# Patient Record
Sex: Male | Born: 1975 | Hispanic: Yes | Marital: Single
Health system: Southern US, Community
[De-identification: ages and names within clinical notes are randomized; demographics above are authoritative.]

## PROBLEM LIST (undated history)

## (undated) HISTORY — PX: EYE SURGERY: SHX253

## (undated) HISTORY — PX: MASS EXCISION: SHX2000

---

## 2014-08-12 ENCOUNTER — Ambulatory Visit: Payer: Self-pay | Admitting: Ophthalmology

## 2015-01-24 NOTE — Op Note (Signed)
PATIENT NAME:  Hector Rangel, [name] MR#:  [id number] DATE OF BIRTH:  1976-06-11  DATE OF PROCEDURE:  08/12/2014  LOCATION:  [facility].   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   OPERATIVE PROCEDURE: Pterygium excision with creation of a conjunctival autograft.   SURGEON: Jerilee FieldWilliam L. Glendel Jaggers, MD.   ANESTHESIA:   1.  Managed anesthesia care.  2.  A 50-50 mixture of 0.75% bupivacaine and 4% Xylocaine.   COMPLICATIONS: None.   DESCRIPTION OF PROCEDURE: The patient was examined and consented for this procedure with the assistance of a professional Spanish interpreter in the preoperative holding area. He was then brought back to the operating room where the anesthesia team employed managed anesthesia care while 5 mL of the aforementioned mixture were placed in the right orbit. The eye was then prepped and draped in the usual sterile ophthalmic fashion. A lid speculum was placed. A single 5-0 silk suture was placed through the superior limbus as a stay suture.   The eye was rotated laterally. A corneal shield was placed. The pterygium was removed with a combination of sharp and blunt dissection with the Westcott scissors. The Beaver blade was used to gently scrape residual scar tissue and Bowman layer of the cornea. The conjunctival defect was measured to be 9 mm x 9 mm.  The stay suture was then used to rotate the eye inferiorly and a conjunctival autograft was measured out at 9 mm x 9 mm.  This graft was created using a combination of sharp and blunt dissection. The graft was moved over the bare sclera medially maintaining its original orientation to the surface of the globe. A total of 8 interrupted 9-0 Vicryl sutures were used to secure the graft in place.   The stay suture was removed and light shield was removed and the eye was dressed with Vigamox followed by Maxitrol  ointment and a gentle pressure patch. The patient was instructed to leave the patch in place until the following morning. He was given a prescription for Percocet should he need it for discomfort overnight.  I will follow up with him in 1 day.    ____________________________ Jerilee FieldWilliam L. Apurva Reily, MD wlp:lt D: 08/12/2014 13:08:08 ET T: 08/12/2014 13:26:11 ET JOB#: [id number]  cc: Dominick Morella L. Oshua Mcconaha, MD, <Dictator>    Jerilee FieldWILLIAM L Jama Mcmiller MD ELECTRONICALLY SIGNED 08/13/2014 9:41

## 2022-01-17 ENCOUNTER — Emergency Department: Payer: BC Managed Care – PPO

## 2022-01-17 ENCOUNTER — Emergency Department
Admission: EM | Admit: 2022-01-17 | Discharge: 2022-01-17 | Disposition: A | Discharge status: Home / Self Care | Payer: BC Managed Care – PPO | Attending: Emergency Medicine | Admitting: Emergency Medicine

## 2022-01-17 ENCOUNTER — Other Ambulatory Visit: Payer: Self-pay

## 2022-01-17 DIAGNOSIS — N50819 Testicular pain, unspecified: Secondary | ICD-10-CM | POA: Diagnosis not present

## 2022-01-17 DIAGNOSIS — N50811 Right testicular pain: Secondary | ICD-10-CM | POA: Diagnosis not present

## 2022-01-17 LAB — URINALYSIS, ROUTINE W REFLEX MICROSCOPIC
Bilirubin Urine: NEGATIVE
Glucose, UA: NEGATIVE mg/dL
Hgb urine dipstick: NEGATIVE
Ketones, ur: NEGATIVE mg/dL
Leukocytes,Ua: NEGATIVE
Nitrite: NEGATIVE
Protein, ur: NEGATIVE mg/dL
Specific Gravity, Urine: 1.024 (ref 1.005–1.030)
pH: 5 (ref 5.0–8.0)

## 2022-01-17 LAB — CHLAMYDIA/NGC RT PCR (ARMC ONLY)
Chlamydia Tr: NOT DETECTED
N gonorrhoeae: NOT DETECTED

## 2022-01-17 MED ORDER — MELOXICAM 15 MG PO TABS: 15.0000 mg | ORAL_TABLET | Freq: Every day | ORAL | 0 refills | Status: DC

## 2022-01-17 NOTE — ED Triage Notes (Addendum)
Pt comes with c/o testicle pain that comes and goes stabbing pains for about month. Pt states no urine issues. ? ?Pt denies any swelling or discharge from penis.  ? ?Pt states pain gets better after sitting for awhile. ?

## 2022-01-17 NOTE — ED Provider Notes (Signed)
? ?[facility] ?Provider Note ? ?Patient Contact: 9:29 PM (approximate) ? ? ?History  ? ?Testicle Pain ? ? ?HPI ? ?Hector Rangel is a 46 y.o. male who presents the emergency department complaining of testicular pain.  Patient has been complaining of intermittent testicular pain on the right side.  This has been ongoing for 2 months.  He states that there is no real exacerbating or alleviating factors.  States that he is having no dysuria, polyuria, hematuria, flank pain.  No penile lesions, discharge.  Patient states that sometimes when the pain hits he will stand up and it feels better.  There is no other associated symptoms of fevers, chills, GI complaints such as nausea, vomiting, diarrhea.  Pain is always on the same side. ?  ? ? ?Physical Exam  ? ?Triage Vital Signs: ?ED Triage Vitals  ?Enc Vitals Group  ?   BP 01/17/22 1636 (!) 150/91  ?   Pulse Rate 01/17/22 1636 76  ?   Resp 01/17/22 1636 18  ?   Temp 01/17/22 1636 98 ?F (36.7 ?C)  ?   Temp src --   ?   SpO2 01/17/22 1636 99 %  ?   Weight --   ?   Height --   ?   Head Circumference --   ?   Peak Flow --   ?   Pain Score 01/17/22 1634 8  ?   Pain Loc --   ?   Pain Edu? --   ?   Excl. in GC? --   ? ? ?Most recent vital signs: ?Vitals:  ? 01/17/22 1636  ?BP: (!) 150/91  ?Pulse: 76  ?Resp: 18  ?Temp: 98 ?F (36.7 ?C)  ?SpO2: 99%  ? ? ? ?General: Alert and in no acute distress.  ?Cardiovascular:  Good peripheral perfusion ?Respiratory: Normal respiratory effort without tachypnea or retractions. Lungs CTAB.  ?Gastrointestinal: Bowel sounds ?4 quadrants. Soft and nontender to palpation. No guarding or rigidity. No palpable masses. No distention. No CVA tenderness. ?Genitourinary: No lesions, chancres. No tenderness or palpable abnormality to R testicle ?Musculoskeletal: Full range of motion to all extremities.  ?Neurologic:  No gross focal neurologic deficits are appreciated.  ?Skin:   No rash noted ?Other: ? ? ?ED Results / Procedures  / Treatments  ? ?Labs ?(all labs ordered are listed, but only abnormal results are displayed) ?Labs Reviewed  ?URINALYSIS, ROUTINE W REFLEX MICROSCOPIC - Abnormal; Notable for the following components:  ?    Result Value  ? Color, Urine YELLOW (*)   ? APPearance HAZY (*)   ? All other components within normal limits  ?CHLAMYDIA/NGC RT PCR ([facility] ONLY)            ? ? ? ?EKG ? ? ? ? ?RADIOLOGY ? ?I personally viewed and evaluated these images as part of my medical decision making, as well as reviewing the written report by the radiologist. ? ?ED Provider Interpretation: No acute findings on ultrasound of the scrotum with Doppler.  Specifically no torsion, masses, hydrocele, varicocele ? ?US SCROTUM W/DOPPLER ? ?Result Date: 01/17/2022 ?CLINICAL DATA:  Testicular pain EXAM: SCROTAL ULTRASOUND DOPPLER ULTRASOUND OF THE TESTICLES TECHNIQUE: Complete ultrasound examination of the testicles, epididymis, and other scrotal structures was performed. Color and spectral Doppler ultrasound were also utilized to evaluate blood flow to the testicles. COMPARISON:  None. FINDINGS: Right testicle Measurements: 4.2 x 2.5 x 3.3 cm. No mass or microlithiasis visualized. Left testicle Measurements: 4.0 x 2.3  x 3.0 cm. No mass or microlithiasis visualized. Right epididymis:  Normal in size and appearance. Left epididymis:  Normal in size and appearance. Hydrocele:  None visualized. Varicocele:  None visualized. Pulsed Doppler interrogation of both testes demonstrates normal low resistance arterial and venous waveforms bilaterally. IMPRESSION: Normal testicular ultrasound. Electronically Signed   By: Merilyn Baba M.D.   On: 01/17/2022 17:43   ? ?PROCEDURES: ? ?Critical Care performed: No ? ?Procedures ? ? ?MEDICATIONS ORDERED IN ED: ?Medications - No data to display ? ? ?IMPRESSION / MDM / ASSESSMENT AND PLAN / ED COURSE  ?I reviewed the triage vital signs and the nursing notes. ?             ?               ? ?Differential diagnosis  includes, but is not limited to, testicular pain, torsion, UTI, STD, epididymitis, varicocele, hydrocele ? ? ?Patient's diagnosis is consistent with testicle pain.  Patient presents emergency department with intermittent right-sided testicle pain.  He denies any urinary symptoms such as dysuria, polyuria, hematuria.  Intermittent nature over the last 2 months with no exacerbating or alleviating factors.  Overall exam was reassuring.  Ultrasound revealed no evidence of epididymal masses, hydrocele, varicocele.  Symptoms have better intermittent for the last 2 months with no urinary symptoms.  No evidence of infection on urinalysis.  Currently asymptomatic.  Given the reassuring urinalysis and imaging I recommend the patient follow-up with urology for further investigation into intermittent testicular pain.. Patient is given ED precautions to return to the ED for any worsening or new symptoms. ? ? ? ?  ? ? ?FINAL CLINICAL IMPRESSION(S) / ED DIAGNOSES  ? ?Final diagnoses:  ?Pain in right testicle  ? ? ? ?Rx / DC Orders  ? ?ED Discharge Orders   ? ?      Ordered  ?  meloxicam (MOBIC) 15 MG tablet  Daily       ? 01/17/22 2153  ? ?  ?  ? ?  ? ? ? ?Note:  This document was prepared using Dragon voice recognition software and may include unintentional dictation errors. ?  ?Darletta Moll, PA-C ?01/17/22 2154 ? ?  ?Harvest Dark, MD ?01/17/22 2233 ? ?

## 2022-02-09 ENCOUNTER — Encounter: Payer: Self-pay | Admitting: Urology

## 2022-02-09 ENCOUNTER — Ambulatory Visit (INDEPENDENT_AMBULATORY_CARE_PROVIDER_SITE_OTHER): Payer: BC Managed Care – PPO | Admitting: Urology

## 2022-02-09 VITALS — BP 147/81 | HR 77 | Ht 67.0 in | Wt 250.0 lb

## 2022-02-09 DIAGNOSIS — N50819 Testicular pain, unspecified: Secondary | ICD-10-CM

## 2022-02-09 LAB — URINALYSIS, COMPLETE
Bilirubin, UA: NEGATIVE
Glucose, UA: NEGATIVE
Ketones, UA: NEGATIVE
Leukocytes,UA: NEGATIVE
Nitrite, UA: NEGATIVE
Protein,UA: NEGATIVE
RBC, UA: NEGATIVE
Specific Gravity, UA: 1.025 (ref 1.005–1.030)
Urobilinogen, Ur: 0.2 mg/dL (ref 0.2–1.0)
pH, UA: 5.5 (ref 5.0–7.5)

## 2022-02-09 LAB — MICROSCOPIC EXAMINATION: Bacteria, UA: NONE SEEN

## 2022-02-09 NOTE — Patient Instructions (Signed)
Dolor p?lvico en hombres ?Pelvic Pain, Male ?El dolor p?lvico se siente en la parte inferior del abdomen, debajo del ombligo y a nivel de las caderas. El dolor puede comenzar en forma repentina (ser agudo), reaparecer (recurrente) o durar mucho tiempo (volverse cr?nico). El dolor p?lvico que dura m?s de 6 meses se considera cr?nico. Hay muchas causas posibles del dolor p?lvico. A veces, la causa es desconocida. ?El dolor p?lvico puede afectar: ?La pr?stata. ?El sistema urinario. ?El tubo digestivo. ?El sistema musculoesquel?tico. Los m?sculos o ligamentos distendidos pueden causar dolor p?lvico. ?Siga estas indicaciones en su casa: ?Medicamentos ?Use los medicamentos de venta libre y los recetados solamente como se lo haya indicado el m?dico. ?Si le recetaron un antibi?tico, t?melo como se lo haya indicado el m?dico. No deje de tomar el antibi?tico aunque comience a sentirse mejor. ?Control del dolor, la rigidez y la hinchaz?n ? ?Tome ba?os de agua caliente (ba?os de asiento). Los ba?os de asiento ayudan a [employer] m?sculos del suelo p?lvico. ?En un ba?o de asiento, el agua solamente llega [employer] caderas y [place] las nalgas. Un ba?o de asiento puede [employer] hogar en la ba?era o en una tina port?til para ba?o de asiento que se coloca sobre el inodoro. ?Si se lo indican, aplique calor en la zona afectada antes de realizar ejercicios. Use la fuente de calor que el m?dico le recomiende, como una compresa de calor h?medo o una almohadilla t?rmica. ?Coloque una [employer] piel y la fuente de Freight forwarder. ?Aplique calor durante 20 a 30 minutos. ?Retire la fuente de calor si la piel se pone de color rojo brillante. Esto es especialmente importante si no puede sentir dolor, calor o fr?o. Puede correr un riesgo mayor de sufrir quemaduras. ?Actividad ?Haga reposo como se lo haya indicado el m?dico. ?Evite las actividades que [employer]. ?Indicaciones generales ? ?Lleve un registro del dolor p?lvico. Escriba  los siguientes datos: ?Cu?ndo comenz? Conservation officer, historic buildings. ?La ubicaci?n del dolor. ?Qu? parece mejorar o Occupational hygienist. ?Cualquier s?ntoma que se presente junto con Conservation officer, historic buildings. ?Siga el plan de tratamiento como se lo haya indicado el m?dico. Esto puede incluir: ?Fisioterapia p?lvica. ?Yoga, meditaci?n y [place] f?sica. ?Biorretroalimentaci?n. Este proceso lo capacita para controlar la respuesta del cuerpo (respuesta psicol?gica) a trav?s de t?cnicas de respiraci?n y m?todos de relajaci?n. Usted trabajar? con un terapeuta mientras se usan m?quinas para controlar sus s?ntomas f?sicos. ?Acupuntura. Este es un tipo de tratamiento en el que se estimulan puntos espec?ficos del cuerpo con agujas delgadas que se introducen a trav?s de la piel para Regulatory affairs officer. ?Inf?rmese lo m?s que pueda sobre c?mo Administrator, sports. Preg?ntele al m?dico si ser?a ?til recurrir a Research officer, trade union. ?Concurra a [place]. Esto es importante. ?Comun?quese con un m?dico si: ?Los medicamentos no Forensic psychologist. ?El dolor regresa. ?Aparecen nuevos s?ntomas. ?Tiene fiebre o escalofr?os. ?Tiene estre?imiento. ?Observa sangre en la orina o en las heces. ?Se siente d?bil o siente que va a desvanecerse. ?Solicite ayuda de inmediato si: ?Tiene un dolor repentino e intenso. ?El Consulting civil engineer. ?Siente dolor intenso junto con fiebre, n?useas, v?mitos o sudoraci?n excesiva. ?Resumen ?El dolor p?lvico se siente en la parte inferior del abdomen, debajo del ombligo y a nivel de las caderas. Hay muchas causas posibles del dolor p?lvico. A veces, la causa es desconocida. ?Use los medicamentos de venta libre y los recetados solamente como se lo haya indicado el m?dico. ?Comun?quese con un m?dico  si tiene s?ntomas nuevos o los s?ntomas empeoran. ?Busque ayuda de inmediato si siente dolor intenso junto con fiebre, n?useas, v?mitos o sudoraci?n excesiva. ?Concurra a [place]. Esto es  importante. ?Esta informaci?n no tiene Marine scientist el consejo del m?dico. Aseg?rese de hacerle al m?dico cualquier pregunta que tenga. ?Document Revised: 10/14/2021 Document Reviewed: 10/14/2021 ?Elsevier Patient Education ? [place]. ? ?

## 2022-02-09 NOTE — Progress Notes (Signed)
? ?  02/09/22 ?1:41 PM  ? ?Hector Rangel ?09-20-1976 ?[id number] ? ?CC: Scrotal pain ? ?HPI: ?46 year old Spanish-speaking male who reports about a year of intermittent mild to moderate scrotal pain.  He cannot identify if this is worse on the left or the right.  He was recently seen in the ER on 01/17/2022 and scrotal ultrasound was benign, UA and STD testing negative, and he was discharged with a month of meloxicam.  He reports this is significantly improved his discomfort, and he rates his occasional discomfort as a 2/10.  He still has a few days of the meloxicam left.  He denies any gross hematuria or urinary symptoms.  He reportedly had a [place] on [place] he was 46 years old, it sounds like this may have been an I&D of an abscess. ? ? ? ?Social History:  reports that he has an unknown smoking status. He has never used smokeless tobacco. He reports that he does not currently use alcohol. He reports that he does not use drugs. ? ?Physical Exam: ?BP (!) 147/81   Pulse 77   Ht 5\' 7"  (1.702 m)   Wt 250 lb (113.4 kg)   BMI 39.16 kg/m?   ? ?Constitutional:  Alert and oriented, No acute distress. ?Cardiovascular: No clubbing, cyanosis, or edema. ?Respiratory: Normal respiratory effort, no increased work of breathing. ?GI: Abdomen is soft, nontender, nondistended, no abdominal masses ?GU: Uncircumcised phallus with patent meatus, testicles 20 cc and descended bilaterally without masses, nontender, no skin lesions.  Left inner groin with a 6 cm linear scar. ? ?Laboratory Data: ?Reviewed, see HPI ? ?Pertinent Imaging: ?I have personally viewed and interpreted the scrotal ultrasound dated 01/17/2022 with no abnormalities. ? ?Assessment & Plan:   ?47 year old male with intermittent scrotal pain over the last year with negative work-up in the ER with normal scrotal ultrasound and normal UA and STD testing.  Physical exam is benign.  His symptoms have improved significantly on  meloxicam.  I recommended behavioral strategies and conservative management with snug fitting underwear, icing as needed, and NSAIDs/icing for flares of pain.  Return precautions discussed, can follow-up as needed with urology ? ?52, MD ?02/09/2022 ? ?[facility] ?[address], [address] ?[place], [place] ?[phone] ? ?

## 2022-02-09 NOTE — Progress Notes (Signed)
Interpreter ID  #[id number] ?

## 2022-06-10 DIAGNOSIS — Z23 Encounter for immunization: Secondary | ICD-10-CM | POA: Diagnosis not present

## 2022-06-10 DIAGNOSIS — N529 Male erectile dysfunction, unspecified: Secondary | ICD-10-CM | POA: Diagnosis not present

## 2022-06-10 DIAGNOSIS — R079 Chest pain, unspecified: Secondary | ICD-10-CM | POA: Diagnosis not present

## 2022-06-10 DIAGNOSIS — Z1211 Encounter for screening for malignant neoplasm of colon: Secondary | ICD-10-CM | POA: Diagnosis not present

## 2022-06-10 DIAGNOSIS — Z0000 Encounter for general adult medical examination without abnormal findings: Secondary | ICD-10-CM | POA: Diagnosis not present

## 2022-06-22 DIAGNOSIS — R079 Chest pain, unspecified: Secondary | ICD-10-CM | POA: Diagnosis not present

## 2022-06-22 DIAGNOSIS — N529 Male erectile dysfunction, unspecified: Secondary | ICD-10-CM | POA: Diagnosis not present

## 2022-06-22 DIAGNOSIS — Z6841 Body Mass Index (BMI) 40.0 and over, adult: Secondary | ICD-10-CM | POA: Diagnosis not present

## 2022-06-22 DIAGNOSIS — Z0000 Encounter for general adult medical examination without abnormal findings: Secondary | ICD-10-CM | POA: Diagnosis not present

## 2022-07-08 DIAGNOSIS — R7989 Other specified abnormal findings of blood chemistry: Secondary | ICD-10-CM | POA: Diagnosis not present

## 2022-07-11 ENCOUNTER — Other Ambulatory Visit: Payer: Self-pay | Admitting: Infectious Diseases

## 2022-07-11 DIAGNOSIS — R7989 Other specified abnormal findings of blood chemistry: Secondary | ICD-10-CM

## 2022-07-13 DIAGNOSIS — R400 Somnolence: Secondary | ICD-10-CM | POA: Diagnosis not present

## 2022-07-13 DIAGNOSIS — I83893 Varicose veins of bilateral lower extremities with other complications: Secondary | ICD-10-CM | POA: Diagnosis not present

## 2022-07-13 DIAGNOSIS — R0683 Snoring: Secondary | ICD-10-CM | POA: Diagnosis not present

## 2022-07-13 DIAGNOSIS — L409 Psoriasis, unspecified: Secondary | ICD-10-CM | POA: Diagnosis not present

## 2022-07-17 DIAGNOSIS — G4733 Obstructive sleep apnea (adult) (pediatric): Secondary | ICD-10-CM | POA: Diagnosis not present

## 2022-07-28 ENCOUNTER — Other Ambulatory Visit (INDEPENDENT_AMBULATORY_CARE_PROVIDER_SITE_OTHER): Payer: Self-pay | Admitting: Nurse Practitioner

## 2022-07-28 DIAGNOSIS — I83893 Varicose veins of bilateral lower extremities with other complications: Secondary | ICD-10-CM

## 2022-07-29 ENCOUNTER — Ambulatory Visit (INDEPENDENT_AMBULATORY_CARE_PROVIDER_SITE_OTHER): Payer: BC Managed Care – PPO | Admitting: Nurse Practitioner

## 2022-07-29 ENCOUNTER — Encounter (INDEPENDENT_AMBULATORY_CARE_PROVIDER_SITE_OTHER): Payer: Self-pay | Admitting: Nurse Practitioner

## 2022-07-29 ENCOUNTER — Ambulatory Visit (INDEPENDENT_AMBULATORY_CARE_PROVIDER_SITE_OTHER): Payer: BC Managed Care – PPO

## 2022-07-29 DIAGNOSIS — I83811 Varicose veins of right lower extremities with pain: Secondary | ICD-10-CM

## 2022-07-29 DIAGNOSIS — I83893 Varicose veins of bilateral lower extremities with other complications: Secondary | ICD-10-CM

## 2022-08-07 ENCOUNTER — Encounter (INDEPENDENT_AMBULATORY_CARE_PROVIDER_SITE_OTHER): Payer: Self-pay | Admitting: Nurse Practitioner

## 2022-08-07 NOTE — Progress Notes (Addendum)
Subjective:    Patient ID: Hector Rangel, male    DOB: 07-Aug-1976, 46 y.o.   MRN: [id number] Chief Complaint  Patient presents with   Establish Care    Referred By dr Ola Spurr    The patient is seen for evaluation of symptomatic varicose veins. The patient relates burning and stinging which worsened steadily throughout the course of the day, particularly with standing. The patient also notes an aching and throbbing pain over the varicosities, particularly with prolonged dependent positions. The symptoms are significantly improved with elevation.  The patient also notes that during hot weather the symptoms are greatly intensified. The patient states the pain from the varicose veins interferes with work, daily exercise, shopping and household maintenance. At this point, the symptoms are persistent and severe enough that they're having a negative impact on lifestyle and are interfering with daily activities.  There is no history of DVT, PE or superficial thrombophlebitis. There is no history of ulceration or hemorrhage. The patient denies a significant family history of varicose veins.  The patient has worn graduated compression in the past, for at least 3 months. At the present time the patient has been using over-the-counter analgesics. There is no history of prior surgical intervention or sclerotherapy.  Noninvasive studies show evidence of reflux in the right great saphenous vein.  No evidence of DVT or superficial thrombophlebitis in the right lower extremity.    Review of Systems  Cardiovascular:  Positive for leg swelling.  All other systems reviewed and are negative.      Objective:   Physical Exam Vitals reviewed.  HENT:     Head: Normocephalic.  Cardiovascular:     Rate and Rhythm: Normal rate.     Pulses: Normal pulses.  Pulmonary:     Effort: Pulmonary effort is normal.  Musculoskeletal:        General: Tenderness present.     Right lower leg: Edema  present.  Skin:    General: Skin is warm and dry.  Neurological:     Mental Status: He is alert and oriented to person, place, and time.  Psychiatric:        Mood and Affect: Mood normal.        Behavior: Behavior normal.        Thought Content: Thought content normal.        Judgment: Judgment normal.     BP (!) 150/94 (BP Location: Left Arm)   Pulse 66   Resp 19   Ht 5\' 7"  (1.702 m)   Wt 260 lb 12.8 oz (118.3 kg)   BMI 40.85 kg/m   History reviewed. No pertinent past medical history.  Social History   Socioeconomic History   Marital status: Single    Spouse name: Not on file   Number of children: Not on file   Years of education: Not on file   Highest education level: Not on file  Occupational History   Not on file  Tobacco Use   Smoking status: Unknown   Smokeless tobacco: Never  Vaping Use   Vaping Use: Never used  Substance and Sexual Activity   Alcohol use: Not Currently   Drug use: Never   Sexual activity: Not on file  Other Topics Concern   Not on file  Social History Narrative   Not on file   Social Determinants of Health   Financial Resource Strain: Not on file  Food Insecurity: Not on file  Transportation Needs: Not on file  Physical  Activity: Not on file  Stress: Not on file  Social Connections: Not on file  Intimate Partner Violence: Not on file    Past Surgical History:  Procedure Laterality Date   EYE SURGERY Left    MASS EXCISION Left    Left groin/leg Mass removal    Family History  Problem Relation Age of Onset   Prostate cancer Neg Hx    Bladder Cancer Neg Hx    Kidney cancer Neg Hx     No Known Allergies      No data to display            CMP  No results found for: "NA", "K", "CL", "CO2", "GLUCOSE", "BUN", "CREATININE", "CALCIUM", "PROT", "ALBUMIN", "AST", "ALT", "ALKPHOS", "BILITOT", "GFRNONAA", "GFRAA"   No results found.     Assessment & Plan:   1. Varicose veins of right lower extremity with  pain Recommend  I have reviewed my previous  discussion with the patient regarding  varicose veins and why they cause symptoms. Patient will continue  wearing graduated compression stockings class 1 on a daily basis, beginning first thing in the morning and removing them in the evening.  The patient is CEAP [id number]   In addition, behavioral modification including elevation during the day was again discussed and this will continue.  The patient has utilized over the counter pain medications and has been exercising.  However, at this time conservative therapy has not alleviated the patient's symptoms of leg pain and swelling  Recommend: laser ablation of the right great saphenous veins to eliminate the symptoms of pain and swelling of the lower extremities caused by the severe superficial venous reflux disease.  - VAS [place] LOWER EXTREMITY VENOUS REFLUX   Current Outpatient Medications on File Prior to Visit  Medication Sig Dispense Refill   sildenafil (VIAGRA) 100 MG tablet Take by mouth.     No current facility-administered medications on file prior to visit.    There are no Patient Instructions on file for this visit. No follow-ups on file.   Georgiana Spinner, NP

## 2023-01-05 ENCOUNTER — Telehealth (INDEPENDENT_AMBULATORY_CARE_PROVIDER_SITE_OTHER): Payer: Self-pay | Admitting: Vascular Surgery

## 2023-01-05 NOTE — Telephone Encounter (Signed)
Via interpreter services, ATC pt to schedule appt. Interpreter tried twice to call and speak/LVM with patient. She states that it rings and then hangs up. The second time she said that he was unavailable. I will ATC again.  right leg GSV laser. see jd. no prior auth req ref # [id number]   1 week post right GSV laser  4 week post right GSV laser. See FB

## 2023-10-25 IMAGING — US US SCROTUM W/ DOPPLER COMPLETE
1 series · 14 of 25 positions shown · non-contrast
Comparison: None.

CLINICAL DATA: Testicular pain

EXAM:
SCROTAL ULTRASOUND
DOPPLER ULTRASOUND OF THE TESTICLES
TECHNIQUE: Complete ultrasound examination of the testicles, epididymis, and
other scrotal structures was performed. Color and spectral Doppler
ultrasound were also utilized to evaluate blood flow to the
testicles.

[Series 1: us scrotum · 14 of 39 slices shown]
[im 1/39]
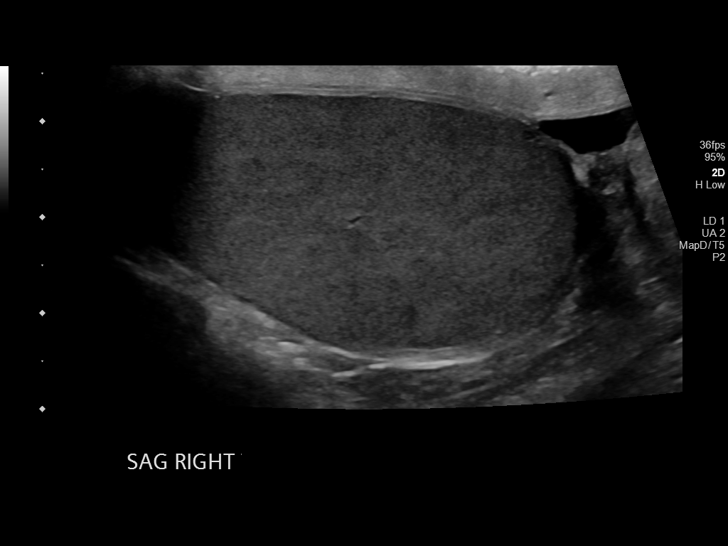
[im 4/39]
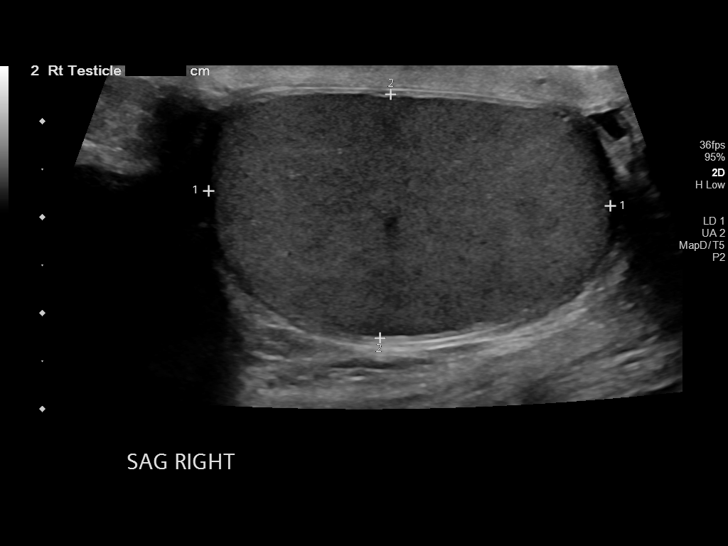
[im 7/39]
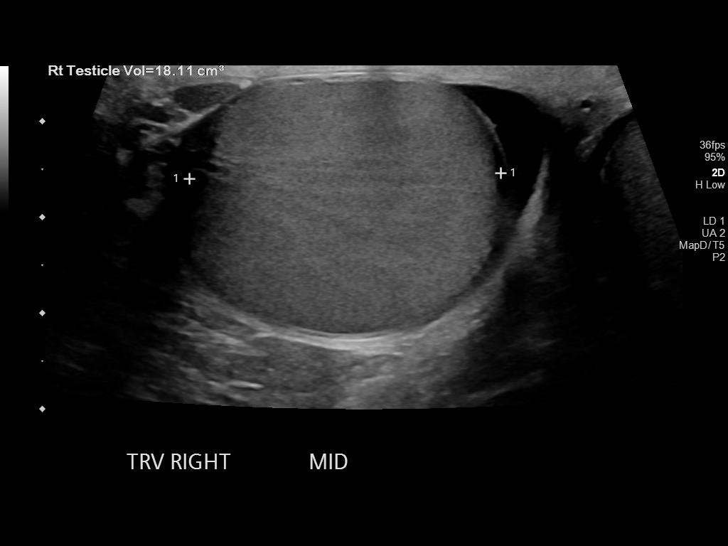
[im 10/39]
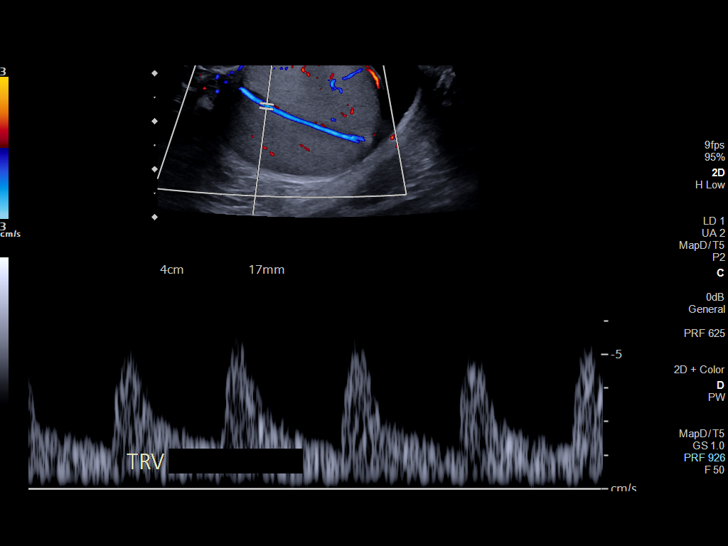
[im 13/39]
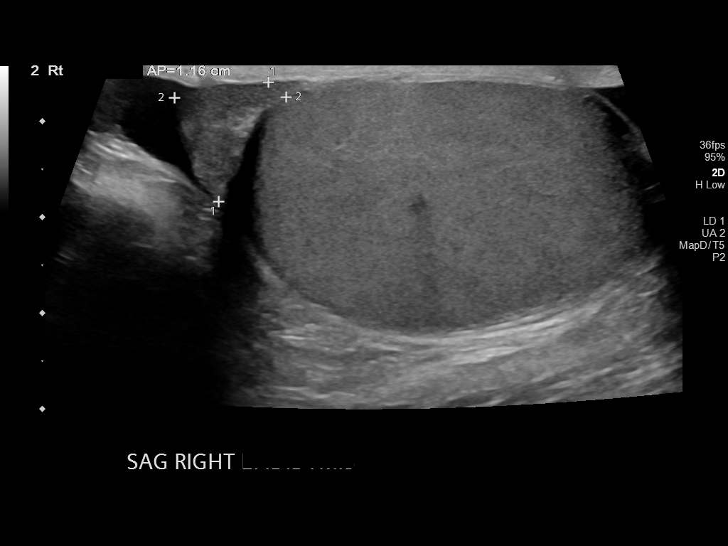
[im 15/39]
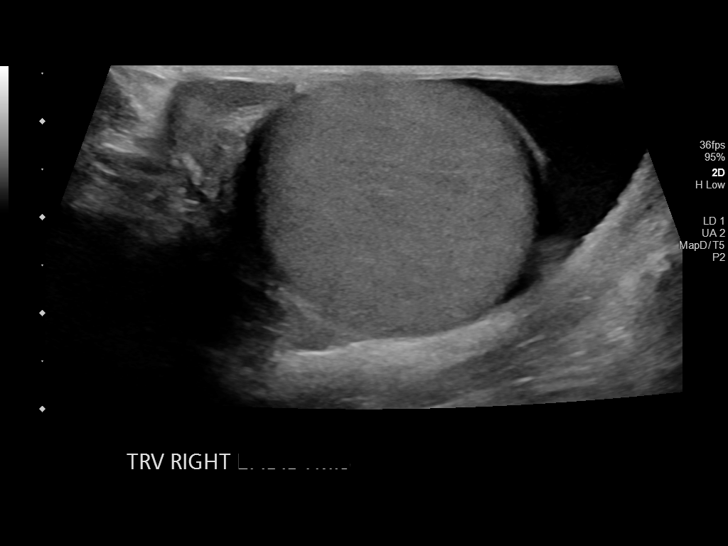
[im 18/39]
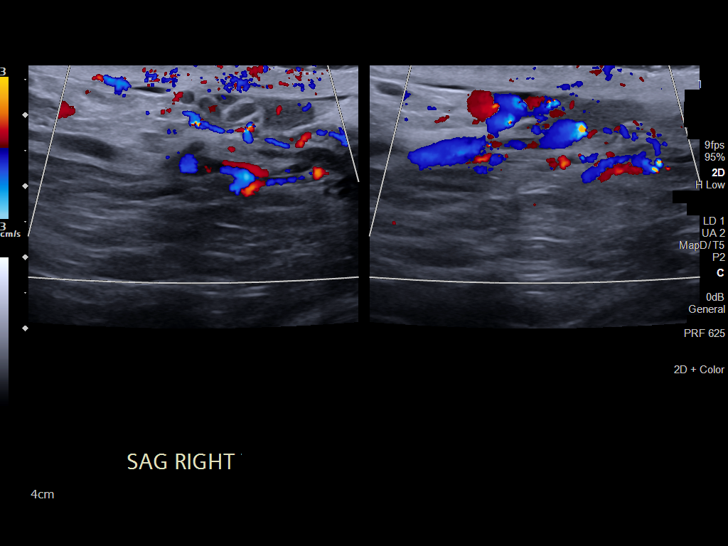
[im 21/39]
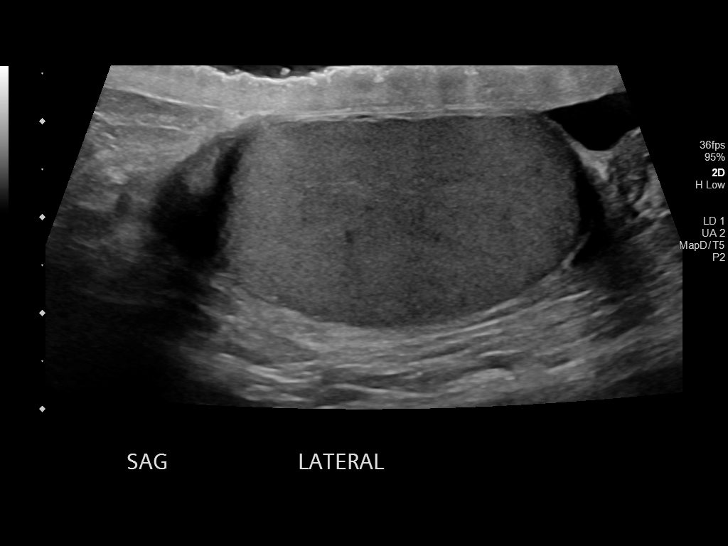
[im 24/39]
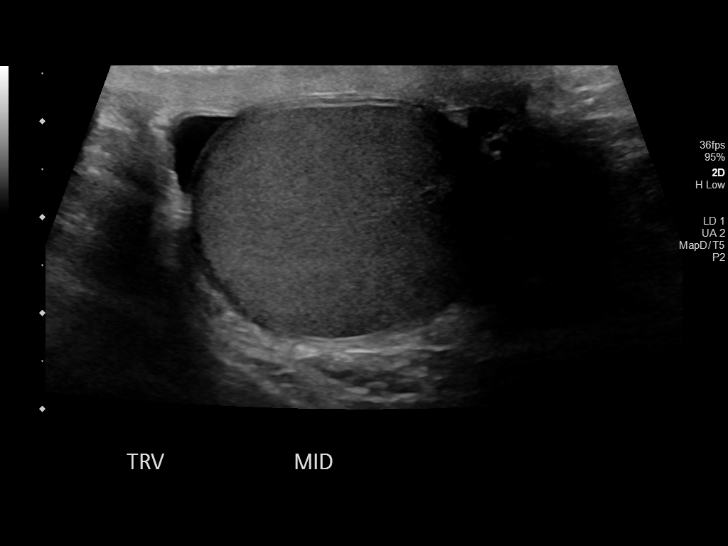
[im 26/39]
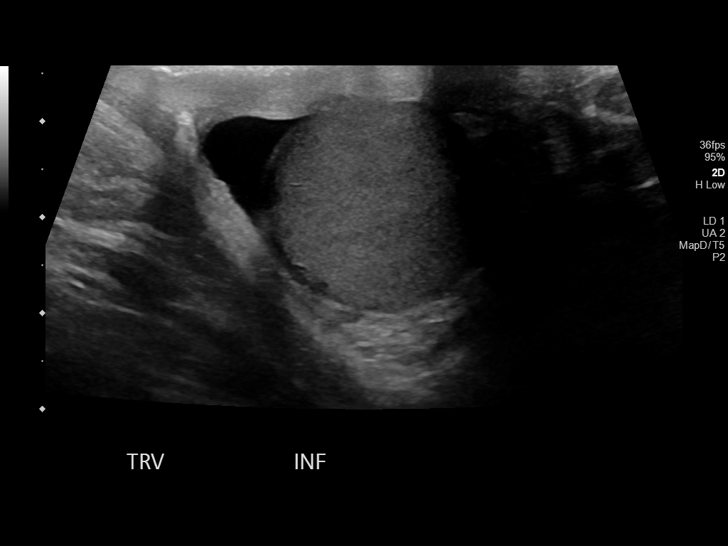
[im 29/39]
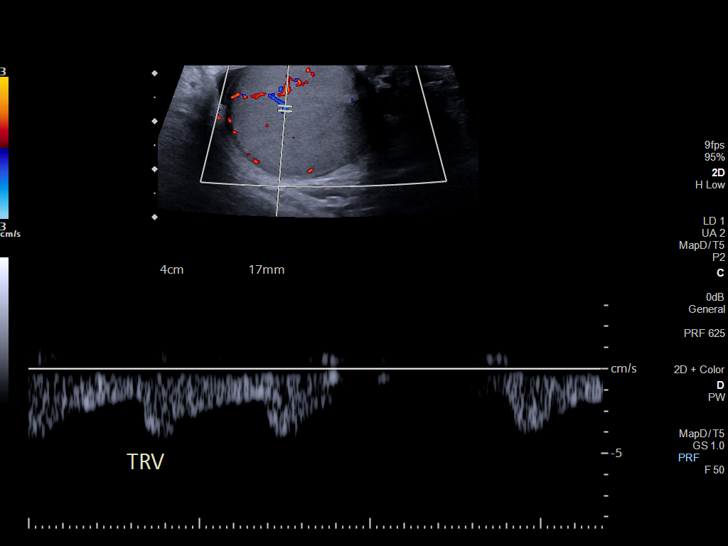
[im 32/39]
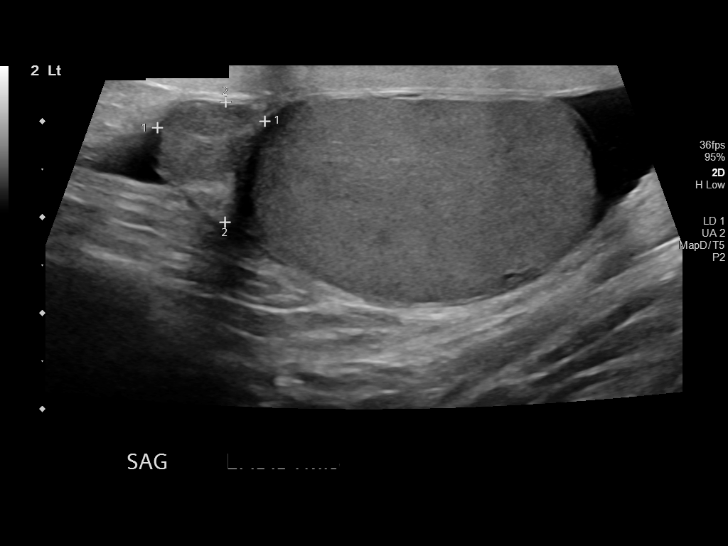
[im 35/39]
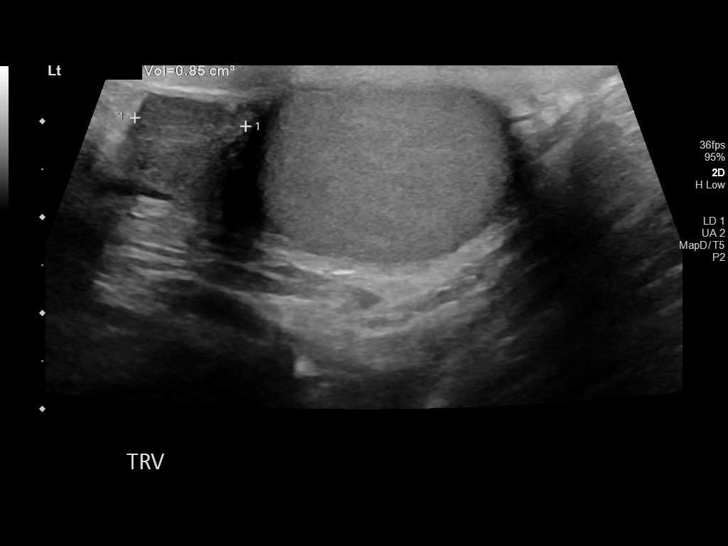
[im 39/39]
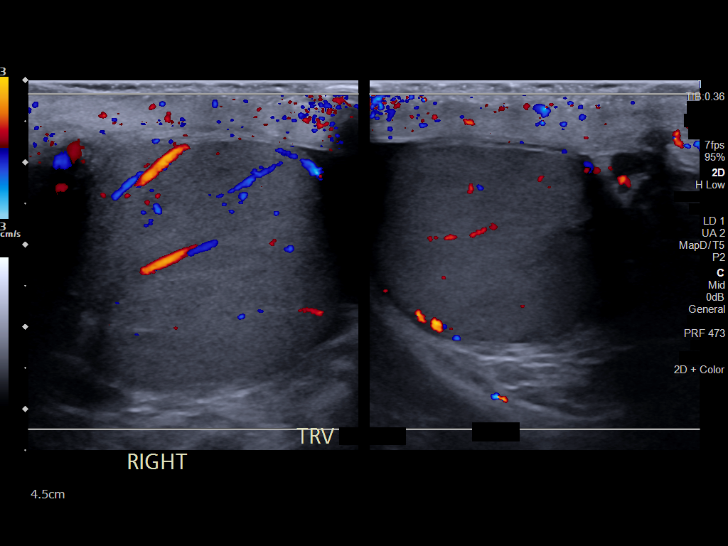

[14 of 25 positions shown; findings below may reference images not displayed]

FINDINGS: Right testicle

Measurements: 4.2 x 2.5 x 3.3 cm. No mass or microlithiasis
visualized.

Left testicle

Measurements: 4.0 x 2.3 x 3.0 cm. No mass or microlithiasis
visualized.

Right epididymis:  Normal in size and appearance.

Left epididymis:  Normal in size and appearance.

Hydrocele:  None visualized.

Varicocele:  None visualized.

Pulsed Doppler interrogation of both testes demonstrates normal low
resistance arterial and venous waveforms bilaterally.
IMPRESSION: Normal testicular ultrasound.

## 2023-12-08 DIAGNOSIS — R7401 Elevation of levels of liver transaminase levels: Secondary | ICD-10-CM | POA: Diagnosis not present

## 2023-12-08 DIAGNOSIS — R051 Acute cough: Secondary | ICD-10-CM | POA: Diagnosis not present

## 2023-12-12 ENCOUNTER — Other Ambulatory Visit: Payer: Self-pay | Admitting: Family Medicine

## 2023-12-12 DIAGNOSIS — R7401 Elevation of levels of liver transaminase levels: Secondary | ICD-10-CM

## 2023-12-21 ENCOUNTER — Ambulatory Visit

## 2023-12-25 ENCOUNTER — Ambulatory Visit
Admission: RE | Admit: 2023-12-25 | Discharge: 2023-12-25 | Disposition: A | Discharge status: Home / Self Care | Source: Ambulatory Visit | Attending: Family Medicine | Admitting: Family Medicine

## 2023-12-25 DIAGNOSIS — R932 Abnormal findings on diagnostic imaging of liver and biliary tract: Secondary | ICD-10-CM | POA: Diagnosis not present

## 2023-12-25 DIAGNOSIS — R7401 Elevation of levels of liver transaminase levels: Secondary | ICD-10-CM | POA: Diagnosis not present

## 2023-12-25 DIAGNOSIS — K7689 Other specified diseases of liver: Secondary | ICD-10-CM | POA: Diagnosis not present

## 2024-02-05 DIAGNOSIS — Z125 Encounter for screening for malignant neoplasm of prostate: Secondary | ICD-10-CM | POA: Diagnosis not present

## 2024-02-05 DIAGNOSIS — D696 Thrombocytopenia, unspecified: Secondary | ICD-10-CM | POA: Diagnosis not present

## 2024-02-05 DIAGNOSIS — K7689 Other specified diseases of liver: Secondary | ICD-10-CM | POA: Diagnosis not present

## 2024-02-05 DIAGNOSIS — Z0000 Encounter for general adult medical examination without abnormal findings: Secondary | ICD-10-CM | POA: Diagnosis not present

## 2024-02-05 DIAGNOSIS — K769 Liver disease, unspecified: Secondary | ICD-10-CM | POA: Diagnosis not present

## 2024-02-05 DIAGNOSIS — R7401 Elevation of levels of liver transaminase levels: Secondary | ICD-10-CM | POA: Diagnosis not present

## 2024-02-06 ENCOUNTER — Other Ambulatory Visit: Payer: Self-pay | Admitting: Infectious Diseases

## 2024-02-06 DIAGNOSIS — K7689 Other specified diseases of liver: Secondary | ICD-10-CM

## 2024-02-13 ENCOUNTER — Ambulatory Visit
Admission: RE | Admit: 2024-02-13 | Discharge: 2024-02-13 | Disposition: A | Discharge status: Home / Self Care | Source: Ambulatory Visit | Attending: Infectious Diseases | Admitting: Infectious Diseases

## 2024-02-13 DIAGNOSIS — K7689 Other specified diseases of liver: Secondary | ICD-10-CM | POA: Insufficient documentation

## 2024-02-13 DIAGNOSIS — R932 Abnormal findings on diagnostic imaging of liver and biliary tract: Secondary | ICD-10-CM | POA: Diagnosis not present

## 2024-02-13 DIAGNOSIS — K7460 Unspecified cirrhosis of liver: Secondary | ICD-10-CM | POA: Diagnosis not present

## 2024-02-13 DIAGNOSIS — R161 Splenomegaly, not elsewhere classified: Secondary | ICD-10-CM | POA: Diagnosis not present

## 2024-02-13 MED ORDER — GADOBUTROL 1 MMOL/ML IV SOLN
9.0000 mL | Freq: Once | INTRAVENOUS | Status: AC | PRN
  Administered 2024-02-13: 9 mL via INTRAVENOUS

## 2024-03-27 DIAGNOSIS — L03115 Cellulitis of right lower limb: Secondary | ICD-10-CM | POA: Diagnosis not present

## 2024-03-28 ENCOUNTER — Other Ambulatory Visit: Payer: Self-pay | Admitting: Physician Assistant

## 2024-03-28 ENCOUNTER — Ambulatory Visit
Admission: RE | Admit: 2024-03-28 | Discharge: 2024-03-28 | Disposition: A | Discharge status: Home / Self Care | Source: Ambulatory Visit | Attending: Physician Assistant | Admitting: Physician Assistant

## 2024-03-28 DIAGNOSIS — R590 Localized enlarged lymph nodes: Secondary | ICD-10-CM | POA: Diagnosis not present

## 2024-03-28 DIAGNOSIS — M79604 Pain in right leg: Secondary | ICD-10-CM | POA: Diagnosis not present

## 2024-03-28 DIAGNOSIS — I8391 Asymptomatic varicose veins of right lower extremity: Secondary | ICD-10-CM | POA: Diagnosis not present

## 2024-03-28 DIAGNOSIS — L03115 Cellulitis of right lower limb: Secondary | ICD-10-CM

## 2024-03-28 DIAGNOSIS — I83811 Varicose veins of right lower extremities with pain: Secondary | ICD-10-CM | POA: Diagnosis not present

## 2024-03-28 DIAGNOSIS — R600 Localized edema: Secondary | ICD-10-CM | POA: Diagnosis not present

## 2024-04-02 DIAGNOSIS — L03115 Cellulitis of right lower limb: Secondary | ICD-10-CM | POA: Diagnosis not present

## 2024-04-04 DIAGNOSIS — L03115 Cellulitis of right lower limb: Secondary | ICD-10-CM | POA: Diagnosis not present

## 2024-04-04 DIAGNOSIS — L02415 Cutaneous abscess of right lower limb: Secondary | ICD-10-CM | POA: Diagnosis not present

## 2024-04-04 DIAGNOSIS — M25571 Pain in right ankle and joints of right foot: Secondary | ICD-10-CM | POA: Diagnosis not present

## 2024-04-04 DIAGNOSIS — R600 Localized edema: Secondary | ICD-10-CM | POA: Diagnosis not present

## 2024-04-04 DIAGNOSIS — M79671 Pain in right foot: Secondary | ICD-10-CM | POA: Diagnosis not present

## 2024-04-08 DIAGNOSIS — L0291 Cutaneous abscess, unspecified: Secondary | ICD-10-CM | POA: Diagnosis not present

## 2024-04-08 DIAGNOSIS — L03115 Cellulitis of right lower limb: Secondary | ICD-10-CM | POA: Diagnosis not present

## 2024-05-08 DIAGNOSIS — L0291 Cutaneous abscess, unspecified: Secondary | ICD-10-CM | POA: Diagnosis not present

## 2024-05-08 DIAGNOSIS — I83811 Varicose veins of right lower extremities with pain: Secondary | ICD-10-CM | POA: Diagnosis not present

## 2024-05-08 DIAGNOSIS — L03115 Cellulitis of right lower limb: Secondary | ICD-10-CM | POA: Diagnosis not present

## 2024-05-08 DIAGNOSIS — K769 Liver disease, unspecified: Secondary | ICD-10-CM | POA: Diagnosis not present

## 2024-08-09 DIAGNOSIS — R7309 Other abnormal glucose: Secondary | ICD-10-CM | POA: Diagnosis not present

## 2024-08-09 DIAGNOSIS — I10 Essential (primary) hypertension: Secondary | ICD-10-CM | POA: Diagnosis not present

## 2024-08-09 DIAGNOSIS — Z1389 Encounter for screening for other disorder: Secondary | ICD-10-CM | POA: Diagnosis not present

## 2024-08-09 DIAGNOSIS — Z0131 Encounter for examination of blood pressure with abnormal findings: Secondary | ICD-10-CM | POA: Diagnosis not present

## 2024-08-09 DIAGNOSIS — J4530 Mild persistent asthma, uncomplicated: Secondary | ICD-10-CM | POA: Diagnosis not present

## 2024-08-09 DIAGNOSIS — B351 Tinea unguium: Secondary | ICD-10-CM | POA: Diagnosis not present

## 2024-08-12 DIAGNOSIS — I10 Essential (primary) hypertension: Secondary | ICD-10-CM | POA: Diagnosis not present

## 2024-08-12 DIAGNOSIS — R7309 Other abnormal glucose: Secondary | ICD-10-CM | POA: Diagnosis not present

## 2024-08-12 DIAGNOSIS — B351 Tinea unguium: Secondary | ICD-10-CM | POA: Diagnosis not present

## 2024-10-30 ENCOUNTER — Telehealth (INDEPENDENT_AMBULATORY_CARE_PROVIDER_SITE_OTHER): Payer: Self-pay | Admitting: Nurse Practitioner

## 2024-10-30 ENCOUNTER — Other Ambulatory Visit: Payer: Self-pay | Admitting: Family Medicine

## 2024-10-30 DIAGNOSIS — R7401 Elevation of levels of liver transaminase levels: Secondary | ICD-10-CM

## 2024-10-30 NOTE — Telephone Encounter (Signed)
 Patient walked in complaining of right leg pain and swelling. Patient stated that his left hurts very little but the left one is really bothering him. Can you please advise what and where the patient needs to be scheduled? Requesting a spanish interp when called.

## 2024-10-30 NOTE — Telephone Encounter (Signed)
 I attempted to contact patient with interpreter to get more information and left a message on voicemail to return call to the office
